# Patient Record
Sex: Male | Born: 2000 | Race: White | Hispanic: No | Marital: Single | Smoking: Never smoker
Health system: Southern US, Community
[De-identification: ages and names within clinical notes are randomized; demographics above are authoritative.]

## PROBLEM LIST (undated history)

## (undated) DIAGNOSIS — T7840XA Allergy, unspecified, initial encounter: Secondary | ICD-10-CM

## (undated) DIAGNOSIS — F909 Attention-deficit hyperactivity disorder, unspecified type: Secondary | ICD-10-CM

## (undated) HISTORY — DX: Attention-deficit hyperactivity disorder, unspecified type: F90.9

## (undated) HISTORY — DX: Allergy, unspecified, initial encounter: T78.40XA

## (undated) HISTORY — PX: COSMETIC SURGERY: SHX468

## (undated) HISTORY — PX: TONSILLECTOMY: SUR1361

---
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
## 2019-02-17 ENCOUNTER — Encounter: Payer: Self-pay | Admitting: Family Medicine

## 2019-02-17 ENCOUNTER — Ambulatory Visit (INDEPENDENT_AMBULATORY_CARE_PROVIDER_SITE_OTHER): Payer: BC Managed Care – PPO | Admitting: Family Medicine

## 2019-02-17 DIAGNOSIS — J302 Other seasonal allergic rhinitis: Secondary | ICD-10-CM | POA: Diagnosis not present

## 2019-02-17 MED ORDER — CETIRIZINE HCL 10 MG PO TABS: 10.0000 mg | ORAL_TABLET | Freq: Every day | ORAL | 5 refills | Status: AC

## 2019-02-17 NOTE — Progress Notes (Signed)
Virtual Visit via Telephone Note  I connected with Stephen Thornton on 02/17/19 at 3:06 PM by telephone and verified that I am speaking with the correct person using two identifiers. Stephen Thornton is currently located at home and mother and father are currently with him during this visit, which he is okay with. The provider, Gwenlyn FudgeBRITNEY F Erlinda Solinger, FNP is located in their home at time of visit.  I discussed the limitations, risks, security and privacy concerns of performing an evaluation and management service by telephone and the availability of in person appointments. I also discussed with the patient that there may be a patient responsible charge related to this service. The patient expressed understanding and agreed to proceed.  Subjective: PCP: Junie SpencerHawks, Christy A, FNP  Chief Complaint  Patient presents with  . Sore Throat   Patient complains of headache and sore throat. No additional symptoms. Onset of symptoms was 2 days ago, gradually improving since that time. He is drinking plenty of fluids. Evaluation to date: none. Treatment to date: warm salt water gargles, Aleve, and chloraseptic spray. He takes Zyrtec as needed but has not been taking it. He has a history of allergies. He does not smoke. Patient has not had recent close contact with someone who has tested positive for COVID-19. I had dad look in his mouth while on the phone and dad denies any erythema or exudate. Patient has had a tonsillectomy. [place].    ROS: Per HPI  Current Outpatient Medications:  .  cetirizine (ZYRTEC) 10 MG tablet, Take 1 tablet (10 mg total) by mouth daily., Disp: 30 tablet, Rfl: 11 .  fluticasone (FLONASE) 50 MCG/ACT nasal spray, Place 2 sprays into both nostrils daily., Disp: 16 g, Rfl: 6 .  omeprazole (PRILOSEC) 40 MG capsule, Take 1 capsule (40 mg total) by mouth daily., Disp: 30 capsule, Rfl: 3  Allergies  Allergen Reactions  . Augmentin [Amoxicillin-Pot  Clavulanate]   . Ceclor [Cefaclor]   . Erythromycin   . Zithromax [Azithromycin] Hives   Past Medical History:  Diagnosis Date  . ADHD (attention deficit hyperactivity disorder)   . Allergy     Observations/Objective: A&O  No respiratory distress or wheezing audible over the phone Mood, judgement, and thought processes all WNL  Assessment and Plan: 1. Seasonal allergies - Discussed sore throat could be symptom of COVID-19; patient and his parents decline testing at this time as they do not feel he has contracted COVID and this is more likely his allergies. He is going to resume taking Zyrtec 10 mg QD and continue chloraseptic spray, Aleve, and warm salt water gargles.  - cetirizine (ZYRTEC) 10 MG tablet; Take 1 tablet (10 mg total) by mouth daily.  Dispense: 30 tablet; Refill: 5   Follow Up Instructions:  I discussed the assessment and treatment plan with the patient. The patient was provided an opportunity to ask questions and all were answered. The patient agreed with the plan and demonstrated an understanding of the instructions.   The patient was advised to call back or seek an in-person evaluation if the symptoms worsen or if the condition fails to improve as anticipated.  The above assessment and management plan was discussed with the patient. The patient verbalized understanding of and has agreed to the management plan. Patient is aware to call the clinic if symptoms persist or worsen. Patient is aware when to return to the clinic for a follow-up visit. Patient educated  on when it is appropriate to go to the emergency department.   Time call ended: 3:21 PM  I provided 15 minutes of non-face-to-face time during this encounter.  Hendricks Limes, MSN, APRN, FNP-C [facility] 02/17/19

## 2019-02-18 ENCOUNTER — Telehealth: Payer: Self-pay | Admitting: Family Medicine

## 2019-02-18 NOTE — Telephone Encounter (Signed)
He does not have a sinus infection so I will not be prescribing an antibiotic. If he would like to proceed with COVID testing, I can order that.

## 2019-02-18 NOTE — Telephone Encounter (Signed)
Patient is complaining with sore throat, headache, congestion, no facial pressure. Please advise

## 2019-02-18 NOTE — Telephone Encounter (Signed)
Spoke to pt, he is aware and still does not want Covid testing

## 2019-05-28 ENCOUNTER — Ambulatory Visit: Payer: BC Managed Care – PPO

## 2020-03-14 ENCOUNTER — Other Ambulatory Visit: Payer: Self-pay | Admitting: Family

## 2020-03-14 DIAGNOSIS — K219 Gastro-esophageal reflux disease without esophagitis: Secondary | ICD-10-CM

## 2020-03-15 NOTE — Telephone Encounter (Signed)
lmtcb to schedule follow up appt for further refills. 

## 2020-03-15 NOTE — Telephone Encounter (Signed)
Hawks. NTBS LOV 02/17/19

## 2020-05-25 ENCOUNTER — Telehealth: Payer: Self-pay

## 2020-05-25 NOTE — Telephone Encounter (Signed)
Patient has not been seen in over a year last refill was told he had to make appt. Refill not sent appointment made.Patient verbalized understanding.

## 2020-05-25 NOTE — Telephone Encounter (Signed)
  Prescription Request  05/25/2020  What is the name of the medication or equipment? omeprazole (PRILOSEC) 40 MG capsule    Have you contacted your pharmacy to request a refill? (if applicable) yes  Which pharmacy would you like this sent to? [facility]    Patient notified that their request is being sent to the clinical staff for review and that they should receive a response within 2 business days.

## 2020-06-03 ENCOUNTER — Ambulatory Visit: Payer: BC Managed Care – PPO | Admitting: Family

## 2020-06-03 ENCOUNTER — Other Ambulatory Visit: Payer: Self-pay

## 2020-06-03 ENCOUNTER — Encounter: Payer: Self-pay | Admitting: Family

## 2020-06-03 VITALS — BP 111/65 | HR 76 | Temp 97.5°F | Ht 70.5 in | Wt 209.2 lb

## 2020-06-03 DIAGNOSIS — Z0001 Encounter for general adult medical examination with abnormal findings: Secondary | ICD-10-CM | POA: Diagnosis not present

## 2020-06-03 DIAGNOSIS — K219 Gastro-esophageal reflux disease without esophagitis: Secondary | ICD-10-CM | POA: Diagnosis not present

## 2020-06-03 DIAGNOSIS — Z0000 Encounter for general adult medical examination without abnormal findings: Secondary | ICD-10-CM

## 2020-06-03 MED ORDER — OMEPRAZOLE 40 MG PO CPDR: 40.0000 mg | DELAYED_RELEASE_CAPSULE | Freq: Every day | ORAL | 4 refills | Status: AC

## 2020-06-03 NOTE — Progress Notes (Signed)
Subjective:    Patient ID: Stephen Thornton, male    DOB: May 29, 2001, 19 y.o.   MRN: [id number]  Chief Complaint  Patient presents with  . Gastroesophageal Reflux   PT presents to the office today for CPE.  Gastroesophageal Reflux He complains of belching and heartburn. This is a chronic problem. The current episode started more than 1 year ago. The problem occurs occasionally. The problem has been waxing and waning. The symptoms are aggravated by certain foods. Risk factors include obesity. He has tried a PPI for the symptoms. The treatment provided moderate relief.      Review of Systems  Gastrointestinal: Positive for heartburn.  All other systems reviewed and are negative.  History reviewed. No pertinent family history. Social History   Socioeconomic History  . Marital status: Single    Spouse name: Not on file  . Number of children: Not on file  . Years of education: Not on file  . Highest education level: Not on file  Occupational History  . Not on file  Tobacco Use  . Smoking status: Never Smoker  . Smokeless tobacco: Never Used  Vaping Use  . Vaping Use: Never used  Substance and Sexual Activity  . Alcohol use: No  . Drug use: No  . Sexual activity: Not on file  Other Topics Concern  . Not on file  Social History Narrative  . Not on file   Social Determinants of Health   Financial Resource Strain:   . Difficulty of Paying Living Expenses: Not on file  Food Insecurity:   . Worried About Programme researcher, broadcasting/film/video in the Last Year: Not on file  . Ran Out of Food in the Last Year: Not on file  Transportation Needs:   . Lack of Transportation (Medical): Not on file  . Lack of Transportation (Non-Medical): Not on file  Physical Activity:   . Days of Exercise per Week: Not on file  . Minutes of Exercise per Session: Not on file  Stress:   . Feeling of Stress : Not on file  Social Connections:   . Frequency of Communication with Friends and Family: Not on file    . Frequency of Social Gatherings with Friends and Family: Not on file  . Attends Religious Services: Not on file  . Active Member of Clubs or Organizations: Not on file  . Attends Banker Meetings: Not on file  . Marital Status: Not on file       Objective:   Physical Exam Vitals reviewed.  Constitutional:      General: He is not in acute distress.    Appearance: He is well-developed.  HENT:     Head: Normocephalic.     Right Ear: Tympanic membrane normal.     Left Ear: Tympanic membrane normal.  Eyes:     General:        Right eye: No discharge.        Left eye: No discharge.     Pupils: Pupils are equal, round, and reactive to light.  Neck:     Thyroid: No thyromegaly.  Cardiovascular:     Rate and Rhythm: Normal rate and regular rhythm.     Heart sounds: Normal heart sounds. No murmur heard.   Pulmonary:     Effort: Pulmonary effort is normal. No respiratory distress.     Breath sounds: Normal breath sounds. No wheezing.  Abdominal:     General: Bowel sounds are normal. There is no  distension.     Palpations: Abdomen is soft.     Tenderness: There is no abdominal tenderness.  Musculoskeletal:        General: No tenderness. Normal range of motion.     Cervical back: Normal range of motion and neck supple.  Skin:    General: Skin is warm and dry.     Findings: No erythema or rash.  Neurological:     Mental Status: He is alert and oriented to person, place, and time.     Cranial Nerves: No cranial nerve deficit.     Deep Tendon Reflexes: Reflexes are normal and symmetric.  Psychiatric:        Behavior: Behavior normal.        Thought Content: Thought content normal.        Judgment: Judgment normal.       BP 111/65   Pulse 76   Temp (!) 97.5 F (36.4 C) (Temporal)   Ht 5' 10.5" (1.791 m)   Wt 209 lb 3.2 oz (94.9 kg)   SpO2 99%   BMI 29.59 kg/m      Assessment & Plan:  [name] comes in today with chief complaint of  Gastroesophageal Reflux   Diagnosis and orders addressed:  1. Gastroesophageal reflux disease -Diet discussed- Avoid fried, spicy, citrus foods, caffeine and alcohol -Do not eat 2-3 hours before bedtime -Encouraged small frequent meals -Avoid NSAID's -RTO if symptoms worsen or do not improve - omeprazole (PRILOSEC) 40 MG capsule; Take 1 capsule (40 mg total) by mouth daily.  Dispense: 90 capsule; Refill: 4  2. Annual physical exam    Pt refuses labs today Health Maintenance reviewed Diet and exercise encouraged  Follow up plan: 1 year    Jannifer Rodney, FNP

## 2020-06-03 NOTE — Patient Instructions (Signed)
Gastroesophageal Reflux Disease, Adult Gastroesophageal reflux (GER) happens when acid from the stomach flows up into the tube that connects the mouth and the stomach (esophagus). Normally, food travels down the esophagus and stays in the stomach to be digested. However, when a person has GER, food and stomach acid sometimes move back up into the esophagus. If this becomes a more serious problem, the person may be diagnosed with a disease called gastroesophageal reflux disease (GERD). GERD occurs when the reflux:  Happens often.  Causes frequent or severe symptoms.  Causes problems such as damage to the esophagus. When stomach acid comes in contact with the esophagus, the acid may cause soreness (inflammation) in the esophagus. Over time, GERD may create small holes (ulcers) in the lining of the esophagus. What are the causes? This condition is caused by a problem with the muscle between the esophagus and the stomach (lower esophageal sphincter, or LES). Normally, the LES muscle closes after food passes through the esophagus to the stomach. When the LES is weakened or abnormal, it does not close properly, and that allows food and stomach acid to go back up into the esophagus. The LES can be weakened by certain dietary substances, medicines, and medical conditions, including:  Tobacco use.  Pregnancy.  Having a hiatal hernia.  Alcohol use.  Certain foods and beverages, such as coffee, chocolate, onions, and peppermint. What increases the risk? You are more likely to develop this condition if you:  Have an increased body weight.  Have a connective tissue disorder.  Use NSAID medicines. What are the signs or symptoms? Symptoms of this condition include:  Heartburn.  Difficult or painful swallowing.  The feeling of having a lump in the throat.  Abitter taste in the mouth.  Bad breath.  Having a large amount of saliva.  Having an upset or bloated  stomach.  Belching.  Chest pain. Different conditions can cause chest pain. Make sure you see your health care provider if you experience chest pain.  Shortness of breath or wheezing.  Ongoing (chronic) cough or a night-time cough.  Wearing away of tooth enamel.  Weight loss. How is this diagnosed? Your health care provider will take a medical history and perform a physical exam. To determine if you have mild or severe GERD, your health care provider may also monitor how you respond to treatment. You may also have tests, including:  A test to examine your stomach and esophagus with a small camera (endoscopy).  A test thatmeasures the acidity level in your esophagus.  A test thatmeasures how much pressure is on your esophagus.  A barium swallow or modified barium swallow test to show the shape, size, and functioning of your esophagus. How is this treated? The goal of treatment is to help relieve your symptoms and to prevent complications. Treatment for this condition may vary depending on how severe your symptoms are. Your health care provider may recommend:  Changes to your diet.  Medicine.  Surgery. Follow these instructions at home: Eating and drinking   Follow a diet as recommended by your health care provider. This may involve avoiding foods and drinks such as: ? Coffee and tea (with or without caffeine). ? Drinks that containalcohol. ? Energy drinks and sports drinks. ? Carbonated drinks or sodas. ? Chocolate and cocoa. ? Peppermint and mint flavorings. ? Garlic and onions. ? Horseradish. ? Spicy and acidic foods, including peppers, chili powder, curry powder, vinegar, hot sauces, and barbecue sauce. ? Citrus fruit juices and citrus   fruits, such as oranges, lemons, and limes. ? Tomato-based foods, such as red sauce, chili, salsa, and pizza with red sauce. ? Fried and fatty foods, such as donuts, french fries, potato chips, and high-fat dressings. ? High-fat  meats, such as hot dogs and fatty cuts of red and white meats, such as rib eye steak, sausage, ham, and bacon. ? High-fat dairy items, such as whole milk, butter, and cream cheese.  Eat small, frequent meals instead of large meals.  Avoid drinking large amounts of liquid with your meals.  Avoid eating meals during the 2-3 hours before bedtime.  Avoid lying down right after you eat.  Do not exercise right after you eat. Lifestyle   Do not use any products that contain nicotine or tobacco, such as cigarettes, e-cigarettes, and chewing tobacco. If you need help quitting, ask your health care provider.  Try to reduce your stress by using methods such as yoga or meditation. If you need help reducing stress, ask your health care provider.  If you are overweight, reduce your weight to an amount that is healthy for you. Ask your health care provider for guidance about a safe weight loss goal. General instructions  Pay attention to any changes in your symptoms.  Take over-the-counter and prescription medicines only as told by your health care provider. Do not take aspirin, ibuprofen, or other NSAIDs unless your health care provider told you to do so.  Wear loose-fitting clothing. Do not wear anything tight around your waist that causes pressure on your abdomen.  Raise (elevate) the head of your bed about 6 inches (15 cm).  Avoid bending over if this makes your symptoms worse.  Keep all follow-up visits as told by your health care provider. This is important. Contact a health care provider if:  You have: ? New symptoms. ? Unexplained weight loss. ? Difficulty swallowing or it hurts to swallow. ? Wheezing or a persistent cough. ? A hoarse voice.  Your symptoms do not improve with treatment. Get help right away if you:  Have pain in your arms, neck, jaw, teeth, or back.  Feel sweaty, dizzy, or light-headed.  Have chest pain or shortness of breath.  Vomit and your vomit looks  like blood or coffee grounds.  Faint.  Have stool that is bloody or black.  Cannot swallow, drink, or eat. Summary  Gastroesophageal reflux happens when acid from the stomach flows up into the esophagus. GERD is a disease in which the reflux happens often, causes frequent or severe symptoms, or causes problems such as damage to the esophagus.  Treatment for this condition may vary depending on how severe your symptoms are. Your health care provider may recommend diet and lifestyle changes, medicine, or surgery.  Contact a health care provider if you have new or worsening symptoms.  Take over-the-counter and prescription medicines only as told by your health care provider. Do not take aspirin, ibuprofen, or other NSAIDs unless your health care provider told you to do so.  Keep all follow-up visits as told by your health care provider. This is important. This information is not intended to replace advice given to you by your health care provider. Make sure you discuss any questions you have with your health care provider. Document Revised: 01/15/2018 Document Reviewed: 01/15/2018 Elsevier Patient Education  2020 [employer]vier Inc[employer]  

## 2021-04-06 ENCOUNTER — Encounter: Payer: Self-pay | Admitting: Family Medicine

## 2021-04-06 ENCOUNTER — Ambulatory Visit (INDEPENDENT_AMBULATORY_CARE_PROVIDER_SITE_OTHER): Payer: BC Managed Care – PPO | Admitting: Family Medicine

## 2021-04-06 DIAGNOSIS — Z20818 Contact with and (suspected) exposure to other bacterial communicable diseases: Secondary | ICD-10-CM

## 2021-04-06 DIAGNOSIS — J029 Acute pharyngitis, unspecified: Secondary | ICD-10-CM

## 2021-04-06 MED ORDER — CEPHALEXIN 500 MG PO CAPS: 500.0000 mg | ORAL_CAPSULE | Freq: Two times a day (BID) | ORAL | 0 refills | Status: AC

## 2021-04-06 NOTE — Progress Notes (Signed)
Virtual Visit via telephone Note Due to COVID-19 pandemic this visit was conducted virtually. This visit type was conducted due to national recommendations for restrictions regarding the COVID-19 Pandemic (e.g. social distancing, sheltering in place) in an effort to limit this patient's exposure and mitigate transmission in our community. All issues noted in this document were discussed and addressed.  A physical exam was not performed with this format.   I connected with Stephen Thornton on 04/06/2021 at 1427 by telephone and verified that I am speaking with the correct person using two identifiers. Stephen Thornton is currently located at home and mother is currently with them during visit. The provider, Kari Baars, FNP is located in their office at time of visit.  I discussed the limitations, risks, security and privacy concerns of performing an evaluation and management service by telephone and the availability of in person appointments. I also discussed with the patient that there may be a patient responsible charge related to this service. The patient expressed understanding and agreed to proceed.  Subjective:  Patient ID: Stephen Thornton, male    DOB: 10/18/2000, 20 y.o.   MRN: [id number]  Chief Complaint:  Sore Throat   HPI: Stephen Thornton is a 20 y.o. male presenting on 04/06/2021 for Sore Throat   Pt reports recent exposure to strep, now has chills, sore throat and swollen lymph nodes in neck.   Sore Throat  This is a new problem. The current episode started in the past 7 days. The problem has been gradually worsening. Neither side of throat is experiencing more pain than the other. The pain is at a severity of 5/10. The pain is moderate. Associated symptoms include swollen glands. Pertinent negatives include no abdominal pain, congestion, coughing, diarrhea, drooling, ear discharge, ear pain, headaches, hoarse voice, plugged ear sensation, neck pain, shortness of breath, stridor,  trouble swallowing or vomiting. He has had exposure to strep. He has tried acetaminophen for the symptoms. The treatment provided no relief.    Relevant past medical, surgical, family, and social history reviewed and updated as indicated.  Allergies and medications reviewed and updated.   Past Medical History:  Diagnosis Date   ADHD (attention deficit hyperactivity disorder)    Allergy     Past Surgical History:  Procedure Laterality Date   COSMETIC SURGERY  age 61   Lip repair after tear   TONSILLECTOMY      Social History   Socioeconomic History   Marital status: Single    Spouse name: Not on file   Number of children: Not on file   Years of education: Not on file   Highest education level: Not on file  Occupational History   Not on file  Tobacco Use   Smoking status: Never   Smokeless tobacco: Never  Vaping Use   Vaping Use: Never used  Substance and Sexual Activity   Alcohol use: No   Drug use: No   Sexual activity: Not on file  Other Topics Concern   Not on file  Social History Narrative   Not on file   Social Determinants of Health   Financial Resource Strain: Not on file  Food Insecurity: Not on file  Transportation Needs: Not on file  Physical Activity: Not on file  Stress: Not on file  Social Connections: Not on file  Intimate Partner Violence: Not on file    Outpatient Encounter Medications as of 04/06/2021  Medication Sig   cephALEXin (KEFLEX) 500 MG capsule Take  1 capsule (500 mg total) by mouth 2 (two) times daily for 10 days.   cetirizine (ZYRTEC) 10 MG tablet Take 1 tablet (10 mg total) by mouth daily. (Patient not taking: Reported on 06/03/2020)   fluticasone (FLONASE) 50 MCG/ACT nasal spray Place 2 sprays into both nostrils daily.   omeprazole (PRILOSEC) 40 MG capsule Take 1 capsule (40 mg total) by mouth daily.   No facility-administered encounter medications on file as of 04/06/2021.    Allergies  Allergen Reactions   Augmentin  [Amoxicillin-Pot Clavulanate]    Ceclor [Cefaclor]    Erythromycin    Zithromax [Azithromycin] Hives    Review of Systems  Constitutional:  Positive for chills. Negative for activity change, appetite change, diaphoresis, fatigue, fever and unexpected weight change.  HENT:  Positive for sore throat. Negative for congestion, dental problem, drooling, ear discharge, ear pain, facial swelling, hearing loss, hoarse voice, mouth sores, nosebleeds, postnasal drip, rhinorrhea, sinus pressure, sinus pain, sneezing, tinnitus, trouble swallowing and voice change.   Eyes: Negative.   Respiratory:  Negative for cough, chest tightness, shortness of breath and stridor.   Cardiovascular:  Negative for chest pain, palpitations and leg swelling.  Gastrointestinal:  Negative for abdominal pain, blood in stool, constipation, diarrhea, nausea and vomiting.  Endocrine: Negative.   Genitourinary:  Negative for decreased urine volume, difficulty urinating, dysuria, frequency and urgency.  Musculoskeletal:  Negative for arthralgias, myalgias and neck pain.  Skin: Negative.   Allergic/Immunologic: Negative.   Neurological:  Negative for dizziness, weakness and headaches.  Hematological: Negative.   Psychiatric/Behavioral:  Negative for confusion, hallucinations, sleep disturbance and suicidal ideas.   All other systems reviewed and are negative.       Observations/Objective: No vital signs or physical exam, this was a telephone or virtual health encounter.  Pt alert and oriented, answers all questions appropriately, and able to speak in full sentences.    Assessment and Plan: [name] was seen today for sore throat.  Diagnoses and all orders for this visit:  Acute pharyngitis, unspecified etiology Exposure to strep throat Recent exposure to strep, now with symptoms. CENTOR criteria met for treatment. Will treat with below as pt is allergic to augmentin.  -     cephALEXin (KEFLEX) 500 MG capsule; Take 1  capsule (500 mg total) by mouth 2 (two) times daily for 10 days.    Follow Up Instructions: Return if symptoms worsen or fail to improve.    I discussed the assessment and treatment plan with the patient. The patient was provided an opportunity to ask questions and all were answered. The patient agreed with the plan and demonstrated an understanding of the instructions.   The patient was advised to call back or seek an in-person evaluation if the symptoms worsen or if the condition fails to improve as anticipated.  The above assessment and management plan was discussed with the patient. The patient verbalized understanding of and has agreed to the management plan. Patient is aware to call the clinic if they develop any new symptoms or if symptoms persist or worsen. Patient is aware when to return to the clinic for a follow-up visit. Patient educated on when it is appropriate to go to the emergency department.    I provided 11 minutes of non-face-to-face time during this encounter. The call started at 1427. The call ended at 1436. The other time was used for coordination of care.    Monia Pouch, FNP-C [facility] [address] [place], [place] [address] [phone]  [phone] 04/06/2021   

## 2022-07-24 ENCOUNTER — Encounter: Payer: Self-pay | Admitting: Family Medicine

## 2022-07-24 ENCOUNTER — Telehealth (INDEPENDENT_AMBULATORY_CARE_PROVIDER_SITE_OTHER): Payer: BC Managed Care – PPO | Admitting: Family Medicine

## 2022-07-24 DIAGNOSIS — U071 COVID-19: Secondary | ICD-10-CM

## 2022-07-24 DIAGNOSIS — J0100 Acute maxillary sinusitis, unspecified: Secondary | ICD-10-CM

## 2022-07-24 MED ORDER — NIRMATRELVIR/RITONAVIR (PAXLOVID)TABLET: 3.0000 | ORAL_TABLET | Freq: Two times a day (BID) | ORAL | 0 refills | Status: AC

## 2022-07-24 MED ORDER — SULFAMETHOXAZOLE-TRIMETHOPRIM 800-160 MG PO TABS: 1.0000 | ORAL_TABLET | Freq: Two times a day (BID) | ORAL | 0 refills | Status: AC

## 2022-07-24 NOTE — Progress Notes (Signed)
Subjective:    Patient ID: BENNIE CHIRICO, male    DOB: Oct 31, 2000, 22 y.o.   MRN: [id number]   HPI: RON BESKE is a 22 y.o. male presenting for positive Covid Test 2 days ago. Sx include tired, head hurts behind eyes. Loss of sense of taste. Sweats, fever, stuffy nose. PND - has green phlegm. Not coughing. Some dyspnea. Just returned from [facility].       06/03/2020    9:12 AM 04/17/2018    4:14 PM 06/12/2017    2:19 PM 06/03/2017    9:11 AM 04/22/2017    6:44 PM  Depression screen PHQ 2/9  Decreased Interest 0 0 0 0 0  Down, Depressed, Hopeless 0 0 0 0 0  PHQ - 2 Score 0 0 0 0 0  Altered sleeping 0    0  Tired, decreased energy 0    0  Change in appetite 0    0  Feeling bad or failure about yourself  0    0  Trouble concentrating 0    0  Moving slowly or fidgety/restless 0    0  Suicidal thoughts 0    0  PHQ-9 Score 0    0  Difficult doing work/chores Not difficult at all    Somewhat difficult     Relevant past medical, surgical, family and social history reviewed and updated as indicated.  Interim medical history since our last visit reviewed. Allergies and medications reviewed and updated.  ROS:  Review of Systems  Constitutional:  Negative for activity change, appetite change, chills and fever.  HENT:  Positive for congestion, postnasal drip, rhinorrhea and sinus pressure. Negative for ear discharge, ear pain, hearing loss, nosebleeds, sneezing and trouble swallowing.   Respiratory:  Negative for chest tightness and shortness of breath.   Cardiovascular:  Negative for chest pain and palpitations.  Skin:  Negative for rash.     Social History   Tobacco Use  Smoking Status Never  Smokeless Tobacco Never       Objective:     Wt Readings from Last 3 Encounters:  06/03/20 209 lb 3.2 oz (94.9 kg)  04/17/18 225 lb (102.1 kg) (98 %, Z= 2.02)*  06/12/17 226 lb 12.8 oz (102.9 kg) (98 %, Z= 2.13)*   * Growth percentiles are based on CDC (Boys, 2-20 Years)  data.     Exam deferred. Pt. Harboring due to COVID 19. Phone visit performed.   Assessment & Plan:   1. COVID-19 virus infection   2. Acute maxillary sinusitis, recurrence not specified     Meds ordered this encounter  Medications   nirmatrelvir/ritonavir (PAXLOVID) 20 x 150 MG & 10 x 100MG  TABS    Sig: Take 3 tablets by mouth 2 (two) times daily for 5 days. (Take nirmatrelvir 150 mg two tablets twice daily for 5 days and ritonavir 100 mg one tablet twice daily for 5 days) Patient GFR is 100    Dispense:  30 tablet    Refill:  0   sulfamethoxazole-trimethoprim (BACTRIM DS) 800-160 MG tablet    Sig: Take 1 tablet by mouth 2 (two) times daily. Until gone, for infection    Dispense:  20 tablet    Refill:  0    No orders of the defined types were placed in this encounter.     Diagnoses and all orders for this visit:  COVID-19 virus infection  Acute maxillary sinusitis, recurrence not specified  Other orders -  nirmatrelvir/ritonavir (PAXLOVID) 20 x 150 MG & 10 x 100MG  TABS; Take 3 tablets by mouth 2 (two) times daily for 5 days. (Take nirmatrelvir 150 mg two tablets twice daily for 5 days and ritonavir 100 mg one tablet twice daily for 5 days) Patient GFR is 100 -     sulfamethoxazole-trimethoprim (BACTRIM DS) 800-160 MG tablet; Take 1 tablet by mouth 2 (two) times daily. Until gone, for infection    Virtual Visit via telephone Note  I discussed the limitations, risks, security and privacy concerns of performing an evaluation and management service by telephone and the availability of in person appointments. The patient was identified with two identifiers. Pt.expressed understanding and agreed to proceed. Pt. Is at home. Dr. Livia Snellen is in his office.  Follow Up Instructions:   I discussed the assessment and treatment plan with the patient. The patient was provided an opportunity to ask questions and all were answered. The patient agreed with the plan and demonstrated an  understanding of the instructions.   The patient was advised to call back or seek an in-person evaluation if the symptoms worsen or if the condition fails to improve as anticipated.   Total minutes including chart review and phone contact time: 13   Follow up plan: Return if symptoms worsen or fail to improve.  Claretta Fraise, MD [place]
